# Patient Record
Sex: Female | Born: 2005
Health system: Southern US, Community
[De-identification: ages and names within clinical notes are randomized; demographics above are authoritative.]

## PROBLEM LIST (undated history)

## (undated) ENCOUNTER — Emergency Department (HOSPITAL_COMMUNITY): Admission: EM | Payer: Medicaid Other | Source: Home / Self Care

## (undated) DIAGNOSIS — U071 COVID-19: Secondary | ICD-10-CM

## (undated) DIAGNOSIS — F909 Attention-deficit hyperactivity disorder, unspecified type: Secondary | ICD-10-CM

## (undated) HISTORY — PX: TONSILLECTOMY: SUR1361

## (undated) HISTORY — PX: EYE SURGERY: SHX253

## (undated) HISTORY — DX: Attention-deficit hyperactivity disorder, unspecified type: F90.9

---
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
## 2023-02-04 ENCOUNTER — Other Ambulatory Visit (HOSPITAL_COMMUNITY): Payer: Self-pay

## 2023-02-26 ENCOUNTER — Other Ambulatory Visit (HOSPITAL_COMMUNITY): Payer: Self-pay

## 2023-03-05 DIAGNOSIS — F902 Attention-deficit hyperactivity disorder, combined type: Secondary | ICD-10-CM | POA: Diagnosis not present

## 2023-03-07 ENCOUNTER — Ambulatory Visit (INDEPENDENT_AMBULATORY_CARE_PROVIDER_SITE_OTHER): Payer: Medicaid Other | Admitting: Pediatrics

## 2023-03-07 ENCOUNTER — Other Ambulatory Visit (HOSPITAL_COMMUNITY): Payer: Self-pay

## 2023-03-07 ENCOUNTER — Encounter: Payer: Self-pay | Admitting: Pediatrics

## 2023-03-07 VITALS — BP 102/62 | Ht 66.5 in | Wt 125.5 lb

## 2023-03-07 DIAGNOSIS — Z0000 Encounter for general adult medical examination without abnormal findings: Secondary | ICD-10-CM | POA: Diagnosis not present

## 2023-03-07 DIAGNOSIS — Z23 Encounter for immunization: Secondary | ICD-10-CM

## 2023-03-07 DIAGNOSIS — Z68.41 Body mass index (BMI) pediatric, 5th percentile to less than 85th percentile for age: Secondary | ICD-10-CM

## 2023-03-07 DIAGNOSIS — Z3041 Encounter for surveillance of contraceptive pills: Secondary | ICD-10-CM | POA: Insufficient documentation

## 2023-03-07 MED ORDER — NORETHIN-ETH ESTRAD-FE BIPHAS 1 MG-10 MCG / 10 MCG PO TABS
1.0000 | ORAL_TABLET | Freq: Every day | ORAL | 2 refills | Status: DC
  Filled 2023-03-07: qty 28, 28d supply, fill #0
  Filled 2023-06-20: qty 28, 28d supply, fill #1

## 2023-03-07 NOTE — Progress Notes (Signed)
 Adolescent Well Care Visit Shannon Owens is a 18 y.o. female who is here for well care.    PCP:  Harrell Gave, NP   History was provided by the patient.  Confidentiality was discussed with the patient and, if applicable, with caregiver as well.  Current Issues: Current concerns include: needs blood work today for [employer]; has started taking iron because was told at ER once that she had low iron.  No medications- saw psychiatrist on Monday. Stopped taking ADHD medications in August 2024, feeling like she is doing really well in school. No fous problems or concerns  Menstruation- regular, last cycle January 25th  Taking birth control daily. No other medications.  Nutrition: Nutrition/Eating Behaviors: good- eating 2 meals a day and snacks Adequate calcium in diet?: yes Supplements/ Vitamins: yes- iron  Exercise/ Media: Play any Sports?/ Exercise: yes Screen Time:  < 2 hours Media Rules or Monitoring?: yes  Sleep:  Sleep: >8 hours - no trouble falling asleep or staying asleep  Social Screening: Lives with: with grandmother Parental relations:  good Activities, Work, and Regulatory affairs officer?: school Concerns regarding behavior with peers?  no Stressors of note: none. Doing well with no concerns for depression or anxiety  Education: School Grade: [employer] School performance: doing well; no concerns  Confidential Social History: Tobacco? no Secondhand smoke exposure?  no Drugs/ETOH?  no  Sexually Active? no   Pregnancy Prevention: OCP  Safe at home, in school & in relationships?  Yes Safe to self?  Yes   Screenings: Patient has a dental home: yes  The following were discussed: eating habits, exercise habits, safety equipment use, bullying, abuse and/or trauma, weapon use, tobacco use, other substance use, reproductive health, and mental health.  Issues were addressed and counseling provided.    Additional topics were addressed as anticipatory  guidance.  PHQ-9 completed and results indicated no risks  Physical Exam:  Vitals:   03/07/23 0846  BP: 102/62  Weight: 125 lb 8 oz (56.9 kg)  Height: 5' 6.5" (1.689 m)   BP 102/62   Ht 5' 6.5" (1.689 m)   Wt 125 lb 8 oz (56.9 kg)   LMP 02/02/2023 (Exact Date)   BMI 19.95 kg/m  Body mass index: body mass index is 19.95 kg/m. Blood pressure %iles are not available for patients who are 18 years or older.  Hearing Screening   500Hz  1000Hz  2000Hz  3000Hz  4000Hz   Right ear 25 25 20 20 20   Left ear 25 25 20 20 20    Vision Screening   Right eye Left eye Both eyes  Without correction     With correction 10/10 10/10     General Appearance:   alert, oriented, no acute distress and well nourished  HENT: Normocephalic, no obvious abnormality, conjunctiva clear  Mouth:   Normal appearing teeth, no obvious discoloration, dental caries, or dental caps  Neck:   Supple; thyroid: no enlargement, symmetric, no tenderness/mass/nodules  Chest normal  Lungs:   Clear to auscultation bilaterally, normal work of breathing  Heart:   Regular rate and rhythm, S1 and S2 normal, no murmurs;   Abdomen:   Soft, non-tender, no mass, or organomegaly  GU normal female  Musculoskeletal:   Tone and strength strong and symmetrical, all extremities               Lymphatic:   No cervical adenopathy  Skin/Hair/Nails:   Skin warm, dry and intact, no rashes, no bruises or petechiae  Neurologic:   Strength, gait,  and coordination normal and age-appropriate    Assessment and Plan:   Well adolescent female BMI is appropriate for age  Hearing screening result:normal Vision screening result: normal  OCP- continue using as directed; medication refilled x 90 days  Orders Placed This Encounter  Procedures   Meningococcal B, OMV   HPV 9-valent vaccine,Recombinat   CBC with Differential/Platelet   Food Allergy Profile   Hepatitis B surface antigen   Hepatitis C antibody   HIV Antibody (routine testing w  rflx)   Lipid panel   Resp Allergy Profile Regn2[facility] [place] [facility] [place] [facility]   RPR   T4, free   TSH   Comprehensive metabolic panel   Men B & HPV vaccines per orders. Indications, contraindications and side effects of vaccine/vaccines discussed with parent and parent verbally expressed understanding and also agreed with the administration of vaccine/vaccines as ordered above today.Handout (VIS) given for each vaccine at this visit.  Lab tests as ordered are REQUIRED for [employer] acceptance. Will return next week for TB skin test.  Return in about 1 year (around 03/06/2024).Harrell Gave, NP

## 2023-03-07 NOTE — Patient Instructions (Signed)
 Well Child Nutrition, Young Adult The following information provides general nutrition recommendations. Talk with a health care provider or a diet and nutrition specialist (dietitian) if you have any questions. Nutrition The amount of food you need to eat every day depends on your age, sex, size, and activity level. To figure out your daily calorie needs, look for a calorie calculator online or talk with your health care provider. Balanced diet Eat a balanced diet. Try to include: Fruits. Aim for 1-2 cups a day. Examples of 1 cup of fruit include 1 large banana, 1 small apple, 8 large strawberries, 1 large orange,  cup (80 g) dried fruit, or 1 cup (250 mL) of 100% fruit juice. Eat a variety of whole fruits and 100% fruit juice. Choose fresh, canned, frozen, or dried forms. Choose canned fruit that has the lowest added sugar or no added sugar. Vegetables. Aim for 2-4 cups a day. Examples of 1 cup of vegetables include 2 medium carrots, 1 large tomato, 2 stalks of celery, or 2 cups (62 g) of raw leafy greens. Choose fresh, frozen, canned, and dried options. Eat vegetables of a variety of colors. Low-fat or fat-free dairy. Aim for 3 cups a day. Examples of 1 cup of dairy include 8 oz (230 mL) of milk, 8 oz (230 g) of yogurt, or 1 oz (44 g) of natural cheese. If you are unable to tolerate dairy (lactose intolerant) or you choose not to consume dairy, you may include fortified soy beverages (soy milk). Grains. Aim for 6-10 "ounce-equivalents" of grain foods (such as pasta, rice, and tortillas) a day. Examples of 1 ounce-equivalent of grains include 1 cup (60 g) of ready-to-eat cereal,  cup (79 g) of cooked rice, or 1 slice of bread. Of the grain foods that you eat each day, aim to include 3-5 ounce-equivalents of whole-grain options. Examples of whole grains include whole wheat, brown rice, wild rice, quinoa, and oats. Lean proteins. Aim for 5-7 ounce-equivalents a day. Eat a variety of protein foods,  including lean meats, seafood, poultry, eggs, legumes (beans and peas), nuts, seeds, and soy products. A cut of meat or fish that is the size of a deck of cards is about 3-4 ounce-equivalents (85 g). Foods that provide 1 ounce-equivalent of protein include 1 egg,  ounce (28 g) of nuts or seeds, or 1 tablespoon (16 g) of peanut butter. For more information and options for foods in a balanced diet, visit www.DisposableNylon.be Tips for healthy snacking A snack should not be the size of a full meal. Eat snacks that have 200 calories or less. Examples include:  whole-wheat pita with  cup (40 g) hummus. 2 or 3 slices of deli [place] wrapped around a cheese stick.  apple with 1 tablespoon (16 g) of peanut butter. 10 baked chips with salsa. Keep cut-up fruits and vegetables available at home and at school so they are easy to eat. Pack healthy snacks the night before or when you pack your lunch. Avoid pre-packaged foods. These tend to be higher in fat, sugar, and salt (sodium). Get involved with shopping, or ask the primary food shopper in your household to get healthy snacks that you like. Avoid chips, candy, cake, and soft drinks. Foods to avoid [place] or heavily processed foods, such as toaster pastries and microwaveable dinners. Drinks that contain a lot of sugar, such as sports drinks, sodas, and juice. Foods that contain a lot of fat, sodium, or sugar. Food safety Prepare your food safely: Wash your hands  after handling raw meats. Keep food preparation surfaces clean by washing them regularly with hot, soapy water. Keep raw meats separate from foods that are ready-to-eat, such as fruits and vegetables. Cook seafood, meat, poultry, and eggs to the recommended minimum safe internal temperature. Store foods at safe temperatures. In general: Keep cold foods at 71F (4C) or colder. Keep your freezer at 79F (-18C or 18 degrees below 0C) or colder. Keep hot foods at 171F (60C) or  warmer. Foods are no longer safe to eat when they have been at a temperature of 40-171F (4-60C) for more than 2 hours. Physical activity Try to get 150 minutes of moderate-intensity physical activity each week. Examples include walking briskly or bicycling slower than 10 miles an hour (16 km an hour). Do muscle-strengthening exercises on 2 or more days a week. If you find it difficult to fit regular physical activity into your schedule, try: Taking the stairs instead of the elevator. Parking your car farther from the entrance or at the back of the parking lot. Biking or walking to work or school. If you need to lose weight, you may need to reduce your daily calorie intake and increase your daily amount of physical activity. Check with your health care provider before you start a new diet and exercise plan. General instructions Do not skip meals, especially breakfast. Water is the ideal beverage. Aim to drink six 8-oz (240 mL) glasses of water each day. Avoid fad diets. These may affect your mood and growth. If you choose to drink alcohol: Drink in moderation. This means two drinks a day for men and one drink a day for women who are not pregnant. One drink equals 12 oz (355 mL) of beer, 5 oz (148 mL) of wine, or 1 oz (44 mL) of hard liquor. You may drink coffee. It is recommended that you limit coffee intake to three to five 8-oz (240 mL) cups a day (up to 400 mg of caffeine). If you are worried about your body image, talk with your parents, your health care provider, or another trusted adult like a coach or counselor. You may be at risk for developing an eating disorder. Eating disorders can lead to serious medical problems. Food allergies may cause you to have a reaction (such as a rash, diarrhea, or vomiting) after eating or drinking. Talk with your health care provider if you have concerns about food allergies. Summary Eat a balanced diet. Include fruits, vegetables, low-fat dairy, whole  grains, and lean proteins. Try to get 150 minutes of moderate-intensity physical activity each week, and do muscle-strengthening exercises on 2 or more days a week. Choose healthy snacks that are 200 calories or less. Drink plenty of water. Try to drink six 8-oz (240 mL) glasses a day. This information is not intended to replace advice given to you by your health care provider. Make sure you discuss any questions you have with your health care provider. Document Revised: 12/13/2020 Document Reviewed: 12/13/2020 Elsevier Patient Education  2024 [employer].

## 2023-03-11 LAB — CAT DANDER COMPONENT
E220-IgE Fel d 2: <0.1 kU/L (ref <0.10)
E228-IgE Fel d 4: <0.1 kU/L (ref <0.10)
Fel d 1 (e94) IgE: 2.15 kU/L — ABNORMAL HIGH (ref <0.10)
Fel d 7 (e231) IgE: 0.18 kU/L — ABNORMAL HIGH (ref <0.10)

## 2023-03-11 LAB — FOOD ALLERGY PROFILE
Allergen, Salmon, f41: <0.1 kU/L
Almonds: <0.1 kU/L
Brazil Nut: <0.1 kU/L
CLASS: 0
CLASS: 0
CLASS: 0
CLASS: 0
CLASS: 0
CLASS: 0
CLASS: 0
CLASS: 0
CLASS: 0
Cashew IgE: <0.1 kU/L
Class: 0
Class: 0
Class: 0
Class: 0
Class: 0
Egg White IgE: <0.1 kU/L
Fish Cod: <0.1 kU/L
Hazelnut: 0.12 kU/L — ABNORMAL HIGH
Macadamia Nut: <0.1 kU/L
Milk IgE: <0.1 kU/L
Peanut IgE: <0.1 kU/L
Scallop IgE: <0.1 kU/L
Sesame Seed f10: 0.33 kU/L — ABNORMAL HIGH
Shrimp IgE: <0.1 kU/L
Soybean IgE: <0.1 kU/L
Tuna IgE: <0.1 kU/L
Walnut: <0.1 kU/L
Wheat IgE: 0.26 kU/L — ABNORMAL HIGH

## 2023-03-11 LAB — RESPIRATORY ALLERGY PROFILE REGION II ~~LOC~~
Allergen, A. alternata, m6: <0.1 kU/L
Allergen, Cedar tree, t12: <0.1 kU/L
Allergen, Comm Silver Birch, t9: 0.29 kU/L — ABNORMAL HIGH
Allergen, Cottonwood, t14: 0.15 kU/L — ABNORMAL HIGH
Allergen, D pternoyssinus,d7: 3.42 kU/L — ABNORMAL HIGH
Allergen, Mouse Urine Protein, e78: <0.1 kU/L
Allergen, Mulberry, t76: <0.1 kU/L
Allergen, Oak,t7: 0.46 kU/L — ABNORMAL HIGH
Allergen, P. notatum, m1: <0.1 kU/L
Aspergillus fumigatus, m3: <0.1 kU/L
Bermuda Grass: <0.1 kU/L
Box Elder IgE: <0.1 kU/L
CLADOSPORIUM HERBARUM (M2) IGE: <0.1 kU/L
COMMON RAGWEED (SHORT) (W1) IGE: 0.12 kU/L — ABNORMAL HIGH
Cat Dander: 2.05 kU/L — ABNORMAL HIGH
Class: 0
Class: 0
Class: 0
Class: 0
Class: 0
Class: 0
Class: 0
Class: 0
Class: 0
Class: 0
Class: 0
Class: 0
Class: 0
Class: 1
Class: 1
Class: 2
Class: 2
Class: 2
Class: 3
Cockroach: <0.1 kU/L
D. farinae: 5.17 kU/L — ABNORMAL HIGH
Dog Dander: 2.87 kU/L — ABNORMAL HIGH
Elm IgE: 0.23 kU/L — ABNORMAL HIGH
IgE (Immunoglobulin E), Serum: 28 kU/L (ref <=114)
Johnson Grass: <0.1 kU/L
Pecan/Hickory Tree IgE: 0.24 kU/L — ABNORMAL HIGH
Rough Pigweed  IgE: <0.1 kU/L
Sheep Sorrel IgE: <0.1 kU/L
Timothy Grass: 0.56 kU/L — ABNORMAL HIGH

## 2023-03-11 LAB — COMPREHENSIVE METABOLIC PANEL
AG Ratio: 2.1 (calc) (ref 1.0–2.5)
ALT: 8 U/L (ref 5–32)
AST: 16 U/L (ref 12–32)
Albumin: 4.8 g/dL (ref 3.6–5.1)
Alkaline phosphatase (APISO): 70 U/L (ref 36–128)
BUN: 11 mg/dL (ref 7–20)
CO2: 24 mmol/L (ref 20–32)
Calcium: 9.4 mg/dL (ref 8.9–10.4)
Chloride: 107 mmol/L (ref 98–110)
Creat: 0.87 mg/dL (ref 0.50–0.96)
Globulin: 2.3 g/dL (ref 2.0–3.8)
Glucose, Bld: 84 mg/dL (ref 65–99)
Potassium: 3.9 mmol/L (ref 3.8–5.1)
Sodium: 139 mmol/L (ref 135–146)
Total Bilirubin: 0.7 mg/dL (ref 0.2–1.1)
Total Protein: 7.1 g/dL (ref 6.3–8.2)

## 2023-03-11 LAB — HEPATITIS B SURFACE ANTIGEN: Hepatitis B Surface Ag: NONREACTIVE

## 2023-03-11 LAB — CBC WITH DIFFERENTIAL/PLATELET
Absolute Lymphocytes: 2871 {cells}/uL (ref 850–3900)
Absolute Monocytes: 451 {cells}/uL (ref 200–950)
Basophils Absolute: 20 {cells}/uL (ref 0–200)
Basophils Relative: 0.4 %
Eosinophils Absolute: 142 {cells}/uL (ref 15–500)
Eosinophils Relative: 2.9 %
HCT: 36.2 % (ref 35.0–45.0)
Hemoglobin: 11.3 g/dL — ABNORMAL LOW (ref 11.7–15.5)
MCH: 24.4 pg — ABNORMAL LOW (ref 27.0–33.0)
MCHC: 31.2 g/dL — ABNORMAL LOW (ref 32.0–36.0)
MCV: 78.2 fL — ABNORMAL LOW (ref 80.0–100.0)
MPV: 9.5 fL (ref 7.5–12.5)
Monocytes Relative: 9.2 %
Neutro Abs: 1416 {cells}/uL — ABNORMAL LOW (ref 1500–7800)
Neutrophils Relative %: 28.9 %
Platelets: 341 10*3/uL (ref 140–400)
RBC: 4.63 10*6/uL (ref 3.80–5.10)
RDW: 16.2 % — ABNORMAL HIGH (ref 11.0–15.0)
Total Lymphocyte: 58.6 %
WBC: 4.9 10*3/uL (ref 3.8–10.8)

## 2023-03-11 LAB — DOG DANDER COMPONENT
Can f 4(e229) IgE: 0.72 kU/L — ABNORMAL HIGH (ref <0.10)
Can f 6(e230) IgE: 0.65 kU/L — ABNORMAL HIGH (ref <0.10)
E101-IgE Can f 1: 0.51 kU/L — ABNORMAL HIGH (ref <0.10)
E102-IgE Can f 2: 0.55 kU/L — ABNORMAL HIGH (ref <0.10)
E221-IgE Can f 3: <0.1 kU/L (ref <0.10)
E226-IgE Can f 5: 1.7 kU/L — ABNORMAL HIGH (ref <0.10)

## 2023-03-11 LAB — LIPID PANEL
Cholesterol: 180 mg/dL — ABNORMAL HIGH (ref <170)
HDL: 56 mg/dL (ref >45)
LDL Cholesterol (Calc): 107 mg/dL (ref <110)
Non-HDL Cholesterol (Calc): 124 mg/dL — ABNORMAL HIGH (ref <120)
Total CHOL/HDL Ratio: 3.2 (calc) (ref <5.0)
Triglycerides: 78 mg/dL (ref <90)

## 2023-03-11 LAB — HIV ANTIBODY (ROUTINE TESTING W REFLEX): HIV 1&2 Ab, 4th Generation: NONREACTIVE

## 2023-03-11 LAB — HEPATITIS C ANTIBODY: Hepatitis C Ab: NONREACTIVE

## 2023-03-11 LAB — TSH: TSH: 2.07 m[IU]/L

## 2023-03-11 LAB — INTERPRETATION:

## 2023-03-11 LAB — RPR: RPR Ser Ql: NONREACTIVE

## 2023-03-11 LAB — T4, FREE: Free T4: 1.1 ng/dL (ref 0.8–1.4)

## 2023-03-16 ENCOUNTER — Ambulatory Visit
Admission: EM | Admit: 2023-03-16 | Discharge: 2023-03-16 | Disposition: A | Discharge status: Home / Self Care | Attending: Family Medicine | Admitting: Family Medicine

## 2023-03-16 ENCOUNTER — Other Ambulatory Visit: Payer: Self-pay

## 2023-03-16 DIAGNOSIS — H6993 Unspecified Eustachian tube disorder, bilateral: Secondary | ICD-10-CM

## 2023-03-16 DIAGNOSIS — H9203 Otalgia, bilateral: Secondary | ICD-10-CM

## 2023-03-16 NOTE — ED Provider Notes (Signed)
 [facility]    CSN: [id number] Arrival date & time: 03/16/23  1152      History   Chief Complaint Chief Complaint  Patient presents with   Otalgia    HPI Shannon Owens is a 18 y.o. female.   The history is provided by the patient.  Otalgia Bilateral ear pain for 4 months, no drainage, has history of frequent ear infections in the past.  Admits slight rhinorrhea.  Admits itchy throat.  Denies fever, chills, sweats, drainage from ears, change in hearing, swollen glands, cough, headache, nausea, vomiting, diarrhea.  No over-the-counter remedies tried.  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      Home Medications    Prior to Admission medications   Medication Sig Start Date End Date Taking? Authorizing Provider  Norethindrone-Ethinyl Estradiol-Fe Biphas (LO LOESTRIN FE) 1 MG-10 MCG / 10 MCG tablet Take 1 tablet by mouth daily. 03/07/23   Harrell Gave, NP    Family History Family History  Problem Relation Age of Onset   Healthy Mother    Healthy Father    Hypertension Paternal Grandmother    Hypertension Paternal Grandfather    Diabetes Paternal Grandfather    Alcohol abuse Neg Hx    Arthritis Neg Hx    Asthma Neg Hx    Birth defects Neg Hx    Cancer Neg Hx    COPD Neg Hx    Depression  Neg Hx    Drug abuse Neg Hx    Early death Neg Hx    Hearing loss Neg Hx    Heart disease Neg Hx    Hyperlipidemia Neg Hx    Learning disabilities Neg Hx    Kidney disease Neg Hx    Mental illness Neg Hx    Mental retardation Neg Hx    Miscarriages / Stillbirths Neg Hx    Stroke Neg Hx    Vision loss Neg Hx    Varicose Veins Neg Hx     Social History Social History   Tobacco Use   Smoking status: Never   Smokeless tobacco: Never  Vaping Use   Vaping status: Never Used  Substance Use Topics   Alcohol use: Never   Drug use: Never     Allergies   Patient has no known allergies.   Review of Systems Review of Systems  HENT:  Positive for ear pain.      Physical Exam Triage Vital Signs ED Triage Vitals  Encounter Vitals Group     BP 03/16/23 1211 117/79     Systolic BP Percentile --      Diastolic BP Percentile --      Pulse Rate 03/16/23 1211 97     Resp 03/16/23 1211 18     Temp  03/16/23 1211 98.1 F (36.7 C)     Temp Source 03/16/23 1211 Oral     SpO2 03/16/23 1211 96 %     Weight 03/16/23 1209 130 lb (59 kg)     Height 03/16/23 1209 5' 6.5" (1.689 m)     Head Circumference --      Peak Flow --      Pain Score 03/16/23 1209 5     Pain Loc --      Pain Education --      Exclude from Growth Chart --    No data found.  Updated Vital Signs BP 117/79 (BP Location: Right Arm)   Pulse 97   Temp 98.1 F (36.7 C) (Oral)   Resp 18   Ht 5' 6.5" (1.689 m)   Wt 130 lb (59 kg)   LMP 02/02/2023 (Exact Date)   SpO2 96%   BMI 20.67 kg/m   Visual Acuity Right Eye Distance:   Left Eye Distance:   Bilateral Distance:    Right Eye Near:   Left Eye Near:    Bilateral Near:     Physical Exam Constitutional:      Appearance: She is not ill-appearing.  HENT:     Head: Normocephalic.     Right Ear: Tympanic membrane and ear canal normal.     Left Ear: Tympanic membrane and ear canal normal.     Nose: Congestion present. No rhinorrhea.     Mouth/Throat:      Mouth: Mucous membranes are moist.     Pharynx: No oropharyngeal exudate.  Eyes:     General:        Right eye: No discharge.        Left eye: No discharge.     Conjunctiva/sclera: Conjunctivae normal.  Cardiovascular:     Rate and Rhythm: Normal rate and regular rhythm.     Heart sounds: Normal heart sounds.  Pulmonary:     Effort: Pulmonary effort is normal. No respiratory distress.     Breath sounds: Normal breath sounds. No wheezing or rales.  Musculoskeletal:     Cervical back: Neck supple.  Lymphadenopathy:     Cervical: No cervical adenopathy.  Skin:    General: Skin is warm and dry.     Findings: No rash.  Neurological:     Mental Status: She is alert.      UC Treatments / Results  Labs (all labs ordered are listed, but only abnormal results are displayed) Labs Reviewed - No data to display  EKG   Radiology No results found.  Procedures Procedures (including critical care time)  Medications Ordered in UC Medications - No data to display  Initial Impression / Assessment and Plan / UC Course  I have reviewed the triage vital signs and the nursing notes.  Pertinent labs & imaging results that were available during my care of the patient were reviewed by me and considered in my medical decision making (see chart for details).     18 year old female with ear pain for months.  Has nasal congestion on exam discussed with patient likely eustachian tube dysfunction recommend she start a regimen of Flonase and daily antihistamine, follow-up if symptoms fail to improve Final Clinical Impressions(s) / UC Diagnoses   Final diagnoses:  Otalgia of both ears  Eustachian tube dysfunction, bilateral     Discharge Instructions      Recommend over-the-counter Flonase 2 sprays each nostril once daily and over-the-counter antihistamine of choice as directed on  the package daily   ED Prescriptions   None    PDMP not reviewed this encounter.   Meliton Rattan, [place] 03/16/23 1242

## 2023-03-16 NOTE — Discharge Instructions (Addendum)
 Recommend over-the-counter Flonase 2 sprays each nostril once daily and over-the-counter antihistamine of choice as directed on the package daily

## 2023-03-16 NOTE — ED Triage Notes (Signed)
 Pt presents with complaints of ringing in ears, dizziness, and bilateral ear pain (more on the right side) x 4 months. Pt states she did feel the need to have her ears checked until recently because the symptoms/pain come and go. Pt currently rates her overall pain a 5/10. Ice applied to ears and alternating Tylenol/Advil with some relief.

## 2023-03-27 DIAGNOSIS — F902 Attention-deficit hyperactivity disorder, combined type: Secondary | ICD-10-CM | POA: Diagnosis not present

## 2023-05-08 ENCOUNTER — Ambulatory Visit (INDEPENDENT_AMBULATORY_CARE_PROVIDER_SITE_OTHER): Admitting: Pediatrics

## 2023-05-08 ENCOUNTER — Encounter: Payer: Self-pay | Admitting: Pediatrics

## 2023-05-08 VITALS — Wt 132.7 lb

## 2023-05-08 DIAGNOSIS — D509 Iron deficiency anemia, unspecified: Secondary | ICD-10-CM | POA: Insufficient documentation

## 2023-05-08 LAB — POCT HEMOGLOBIN: Hemoglobin: 10 g/dL (ref 11–14.6)

## 2023-05-08 NOTE — Progress Notes (Signed)
 Presents today for iron re-check based on history of anemia. Has not been taking iron supplementation recently.   Results for orders placed or performed in visit on 05/08/23 (from the past 24 hours)  POCT hemoglobin     Status: None   Collection Time: 05/08/23 12:11 PM  Result Value Ref Range   Hemoglobin 10.0 11 - 14.6 g/dL   Continue iron supplementation as recommended

## 2023-05-08 NOTE — Patient Instructions (Signed)
 Iron-Rich Diet  Iron is a mineral that helps your body produce hemoglobin. Hemoglobin is a protein in red blood cells that carries oxygen to your body's tissues. Eating too little iron may cause you to feel weak and tired, and it can increase your risk of infection. Iron is naturally found in many foods, and many foods have iron added to them (are iron-fortified). You may need to follow an iron-rich diet if you do not have enough iron in your body due to certain medical conditions. The amount of iron that you need each day depends on your age, your sex, and any medical conditions you have. Follow instructions from your health care provider or a dietitian about how much iron you should eat each day. What are tips for following this plan? Reading food labels Check food labels to see how many milligrams (mg) of iron are in each serving. Cooking Cook foods in pots and pans that are made from iron. Take these steps to make it easier for your body to absorb iron from certain foods: Soak beans overnight before cooking. Soak whole grains overnight and drain them before using. Ferment flours before baking, such as by using yeast in bread dough. Meal planning When you eat foods that contain iron, you should eat them with foods that are high in vitamin C. These include oranges, peppers, tomatoes, potatoes, and mangoes. Vitamin C helps your body absorb iron. Certain foods and drinks prevent your body from absorbing iron properly. Avoid eating these foods in the same meal as iron-rich foods or with iron supplements. These foods include: Coffee, black tea, and red wine. Milk, dairy products, and foods that are high in calcium. Beans and soybeans. Whole grains. General information Take iron supplements only as told by your health care provider. An overdose of iron can be life-threatening. If you were prescribed iron supplements, take them with orange juice or a vitamin C supplement. When you eat  iron-fortified foods or take an iron supplement, you should also eat foods that naturally contain iron, such as meat, poultry, and fish. Eating naturally iron-rich foods helps your body absorb the iron that is added to other foods or contained in a supplement. Iron from animal sources is better absorbed than iron from plant sources. What foods should I eat? Fruits Prunes. Raisins. Eat fruits high in vitamin C, such as oranges, grapefruits, and strawberries, with iron-rich foods. Vegetables Spinach (cooked). Green peas. Broccoli. Fermented vegetables. Eat vegetables high in vitamin C, such as leafy greens, potatoes, bell peppers, and tomatoes, with iron-rich foods. Grains Iron-fortified breakfast cereal. Iron-fortified whole-wheat bread. Enriched rice. Sprouted grains. Meats and other proteins Beef liver. Beef. [place]. Chicken. Oysters. Shrimp. Tuna. Sardines. Chickpeas. Nuts. Tofu. Pumpkin seeds. Beverages Tomato juice. Fresh orange juice. Prune juice. Hibiscus tea. Iron-fortified instant breakfast shakes. Sweets and desserts Blackstrap molasses. Seasonings and condiments Tahini. Fermented soy sauce. Other foods Wheat germ. The items listed above may not be a complete list of recommended foods and beverages. Contact a dietitian for more information. What foods should I limit? These are foods that should be limited while eating iron-rich foods as they can reduce the absorption of iron in your body. Grains Whole grains. Bran cereal. Bran flour. Meats and other proteins Soybeans. Products made from soy protein. Black beans. Lentils. Mung beans. Split peas. Dairy Milk. Cream. Cheese. Yogurt. Cottage cheese. Beverages Coffee. Black tea. Red wine. Sweets and desserts Cocoa. Chocolate. Ice cream. Seasonings and condiments Basil. Oregano. Large amounts of parsley. The items listed  above may not be a complete list of foods and beverages you should limit. Contact a dietitian for more  information. Summary Iron is a mineral that helps your body produce hemoglobin. Hemoglobin is a protein in red blood cells that carries oxygen to your body's tissues. Iron is naturally found in many foods, and many foods have iron added to them (are iron-fortified). When you eat foods that contain iron, you should eat them with foods that are high in vitamin C. Vitamin C helps your body absorb iron. Certain foods and drinks prevent your body from absorbing iron properly, such as whole grains and dairy products. You should avoid eating these foods in the same meal as iron-rich foods or with iron supplements. This information is not intended to replace advice given to you by your health care provider. Make sure you discuss any questions you have with your health care provider. Document Revised: 12/07/2019 Document Reviewed: 12/07/2019 Elsevier Patient Education  2024 [employer].

## 2023-06-01 ENCOUNTER — Telehealth: Admitting: Physician Assistant

## 2023-06-01 ENCOUNTER — Other Ambulatory Visit (HOSPITAL_COMMUNITY): Payer: Self-pay

## 2023-06-01 DIAGNOSIS — B3731 Acute candidiasis of vulva and vagina: Secondary | ICD-10-CM | POA: Diagnosis not present

## 2023-06-01 MED ORDER — FLUCONAZOLE 150 MG PO TABS
150.0000 mg | ORAL_TABLET | ORAL | 0 refills | Status: DC | PRN
Start: 2023-06-01 — End: 2023-06-29
  Filled 2023-06-01: qty 2, 6d supply, fill #0

## 2023-06-01 NOTE — Patient Instructions (Signed)
 [facility], thank you for joining Angelia Kelp, PA-C for today's virtual visit.  While this provider is not your primary care provider (PCP), if your PCP is located in our provider database this encounter information will be shared with them immediately following your visit.   A [facility] MyChart account gives you access to today's visit and all your visits, tests, and labs performed at [facility] " click here if you don't have a [facility] MyChart account or go to [email or web address]  Consent: (Patient) [facility] Owens provided verbal consent for this virtual visit at the beginning of the encounter.  Current Medications:  Current Outpatient Medications:    fluconazole  (DIFLUCAN ) 150 MG tablet, Take 1 tablet (150 mg total) by mouth every 3 (three) days as needed., Disp: 2 tablet, Rfl: 0   Norethindrone-Ethinyl Estradiol -Fe Biphas (LO LOESTRIN FE ) 1 MG-10 MCG / 10 MCG tablet, Take 1 tablet by mouth daily., Disp: 28 tablet, Rfl: 2   Medications ordered in this encounter:  Meds ordered this encounter  Medications   fluconazole  (DIFLUCAN ) 150 MG tablet    Sig: Take 1 tablet (150 mg total) by mouth every 3 (three) days as needed.    Dispense:  2 tablet    Refill:  0    Supervising Provider:   LAMPTEY, PHILIP O [[id number]]     *If you need refills on other medications prior to your next appointment, please contact your pharmacy*  Follow-Up: Call back or seek an in-person evaluation if the symptoms worsen or if the condition fails to improve as anticipated.  [facility] [phone]  Other Instructions Healthy vaginal hygiene practices    -  Avoid sleeper pajamas. Nightgowns allow air to circulate.  Sleep without underpants whenever possible.   -  Wear cotton underpants during the day. Double-rinse underwear after washing to avoid residual irritants. Do not use fabric softeners for underwear and swimsuits.   - Avoid tights, leotards,  leggings, "skinny" jeans, and other tight-fitting clothing. Skirts and loose-fitting pants allow air to circulate.   - Avoid pantyliners.  Instead use tampons or cotton pads.   - Use the restroom after intercourse to help prevent UTI's   - Daily warm bathing is helpful:     - Soak in clean water (no soap) for 10 to 15 minutes. Adding vinegar or baking soda to the water has not been specifically studied and may not be better than clean water alone.      - Use soap to wash regions other than the genital area just before getting out of the tub. Limit use of any soap on genital areas. Use fragance-free soaps.     - Rinse the genital area well and gently pat dry.  Don't rub.  Hair dryer to assist with drying can be used only if on cool setting.     - Do not use bubble baths or perfumed soaps.   - Do not use any feminine sprays, douches or powders.  These contain chemicals that will irritate the skin.   - If the genital area is tender or swollen, cool compresses may relieve the discomfort. Unscented wet wipes can be used instead of toilet paper for wiping.    - Emollients, such as Vaseline, may help protect skin and can be applied to the irritated area.   - Always remember to wipe front-to-back after bowel movements. Pat dry after urination.   - Do not sit in wet swimsuits for long periods of  time after swimming    If you have been instructed to have an in-person evaluation today at a local Urgent Care facility, please use the link below. It will take you to a list of all of our available [facility], including address, phone number and hours of operation. Please do not delay care.  [facility]  If you or a family member do not have a primary care provider, use the link below to schedule a visit and establish care. When you choose a [facility] primary care physician or advanced practice provider, you gain a long-term partner in health. Find a Primary Care  Provider  Learn more about [facility]'s in-office and virtual care options: [facility] - Get Care Now

## 2023-06-01 NOTE — Progress Notes (Signed)
 Virtual Visit Consent   Trina Fujita, you are scheduled for a virtual visit with a [name] provider today. Just as with appointments in the office, your consent must be obtained to participate. Your consent will be active for this visit and any virtual visit you may have with one of our providers in the next 365 days. If you have a MyChart account, a copy of this consent can be sent to you electronically.  As this is a virtual visit, video technology does not allow for your provider to perform a traditional examination. This may limit your provider's ability to fully assess your condition. If your provider identifies any concerns that need to be evaluated in person or the need to arrange testing (such as labs, EKG, etc.), we will make arrangements to do so. Although advances in technology are sophisticated, we cannot ensure that it will always work on either your end or our end. If the connection with a video visit is poor, the visit may have to be switched to a telephone visit. With either a video or telephone visit, we are not always able to ensure that we have a secure connection.  By engaging in this virtual visit, you consent to the provision of healthcare and authorize for your insurance to be billed (if applicable) for the services provided during this visit. Depending on your insurance coverage, you may receive a charge related to this service.  I need to obtain your verbal consent now. Are you willing to proceed with your visit today? [name] has provided verbal consent on 06/01/2023 for a virtual visit (video or telephone). Angelia Kelp, PA-C  Date: 06/01/2023 10:31 AM   Virtual Visit via Video Note   I, Angelia Kelp, connected with  Sh[facility]  ([id number], September 02, 2005) on 06/01/23 at 10:30 AM EDT by a video-enabled telemedicine application and verified that I am speaking with the correct person using two identifiers.  Location: Patient: Virtual  Visit Location Patient: Home Provider: Virtual Visit Location Provider: Home Office   I discussed the limitations of evaluation and management by telemedicine and the availability of in person appointments. The patient expressed understanding and agreed to proceed.    History of Present Illness: [facility] Owens is a 18 y.o. who identifies as a female who was assigned female at birth, and is being seen today for vaginal irritation.  HPI: Vaginal Discharge The patient's primary symptoms include genital itching and vaginal discharge. This is a new problem. The current episode started yesterday. The problem occurs constantly. She is not pregnant. Associated symptoms include abdominal pain. Pertinent negatives include no back pain, chills, diarrhea, dysuria, fever, frequency, headaches, hematuria, nausea or urgency. The vaginal discharge was white and thick. There has been no bleeding. She has not been passing clots. She has not been passing tissue. The symptoms are aggravated by tactile pressure. She has tried nothing for the symptoms. The treatment provided no relief.     Problems:  Patient Active Problem List   Diagnosis Date Noted   Iron deficiency anemia 05/08/2023   Uses oral contraceptive without problem 03/07/2023   BMI (body mass index), pediatric, 5% to less than 85% for age 63/02/2021   Annual physical exam 11/18/2013    Allergies: No Known Allergies Medications:  Current Outpatient Medications:    fluconazole  (DIFLUCAN ) 150 MG tablet, Take 1 tablet (150 mg total) by mouth every 3 (three) days as needed., Disp: 2 tablet, Rfl: 0   Norethindrone-Ethinyl Estradiol -Fe Biphas (  LO LOESTRIN FE ) 1 MG-10 MCG / 10 MCG tablet, Take 1 tablet by mouth daily., Disp: 28 tablet, Rfl: 2  Observations/Objective: Patient is well-developed, well-nourished in no acute distress.  Resting comfortably at home.  Head is normocephalic, atraumatic.  No labored breathing.  Speech is clear and coherent  with logical content.  Patient is alert and oriented at baseline.    Assessment and Plan: 1. Yeast vaginitis (Primary) - fluconazole  (DIFLUCAN ) 150 MG tablet; Take 1 tablet (150 mg total) by mouth every 3 (three) days as needed.  Dispense: 2 tablet; Refill: 0  - Symptoms consistent with yeast vaginitis - Fluconazole  prescribed - Limit bubble baths, scented lotions/soaps/detergents - Limit tight fitting clothing - Seek on person evaluation if not improving or if symptoms worsen   Follow Up Instructions: I discussed the assessment and treatment plan with the patient. The patient was provided an opportunity to ask questions and all were answered. The patient agreed with the plan and demonstrated an understanding of the instructions.  A copy of instructions were sent to the patient via MyChart unless otherwise noted below.    The patient was advised to call back or seek an in-person evaluation if the symptoms worsen or if the condition fails to improve as anticipated.    Angelia Kelp, PA-C

## 2023-06-25 ENCOUNTER — Ambulatory Visit: Admitting: Pediatrics

## 2023-06-25 ENCOUNTER — Encounter: Payer: Self-pay | Admitting: Pediatrics

## 2023-06-25 ENCOUNTER — Ambulatory Visit (INDEPENDENT_AMBULATORY_CARE_PROVIDER_SITE_OTHER): Admitting: Pediatrics

## 2023-06-25 VITALS — Wt 130.0 lb

## 2023-06-25 DIAGNOSIS — D509 Iron deficiency anemia, unspecified: Secondary | ICD-10-CM

## 2023-06-25 LAB — POCT HEMOGLOBIN: Hemoglobin: 12.5 g/dL (ref 11–14.6)

## 2023-06-25 NOTE — Progress Notes (Signed)
 Presents today for iron re-check based on history of anemia. Has not been taking iron supplementation recently.   Results for orders placed or performed in visit on 06/25/23 (from the past 24 hours)  POCT hemoglobin     Status: Normal   Collection Time: 06/25/23 12:03 PM  Result Value Ref Range   Hemoglobin 12.5 11 - 14.6 g/dL   Continue iron supplementation as recommended

## 2023-06-25 NOTE — Patient Instructions (Signed)
 Iron-Rich Diet    Iron is a mineral that helps your body produce hemoglobin. Hemoglobin is a protein in red blood cells that carries oxygen to your body's tissues. Eating too little iron may cause you to feel weak and tired, and it can increase your risk of infection. Iron is naturally found in many foods, and many foods have iron added to them (are iron-fortified).  You may need to follow an iron-rich diet if you do not have enough iron in your body due to certain medical conditions. The amount of iron that you need each day depends on your age, your sex, and any medical conditions you have. Follow instructions from your health care provider or a dietitian about how much iron you should eat each day.  What are tips for following this plan?  Reading food labels  Check food labels to see how many milligrams (mg) of iron are in each serving.  Cooking  Cook foods in pots and pans that are made from iron.  Take these steps to make it easier for your body to absorb iron from certain foods:  Soak beans overnight before cooking.  Soak whole grains overnight and drain them before using.  Ferment flours before baking, such as by using yeast in bread dough.  Meal planning  When you eat foods that contain iron, you should eat them with foods that are high in vitamin C. These include oranges, peppers, tomatoes, potatoes, and mangoes. Vitamin C helps your body absorb iron.  Certain foods and drinks prevent your body from absorbing iron properly. Avoid eating these foods in the same meal as iron-rich foods or with iron supplements. These foods include:  Coffee, black tea, and red wine.  Milk, dairy products, and foods that are high in calcium.  Beans and soybeans.  Whole grains.  General information  Take iron supplements only as told by your health care provider. An overdose of iron can be life-threatening. If you were prescribed iron supplements, take them with orange juice or a vitamin C supplement.  When you eat  iron-fortified foods or take an iron supplement, you should also eat foods that naturally contain iron, such as meat, poultry, and fish. Eating naturally iron-rich foods helps your body absorb the iron that is added to other foods or contained in a supplement.  Iron from animal sources is better absorbed than iron from plant sources.  What foods should I eat?  Vegetables  Spinach (cooked). Green peas. Broccoli. Fermented vegetables.  Eat vegetables high in vitamin C, such as leafy greens, potatoes, bell peppers, and tomatoes, with iron-rich foods.  Grains  Iron-fortified breakfast cereal. Iron-fortified whole-wheat bread. Enriched rice. Sprouted grains.  Meats and other proteins  Beef liver. Beef. [place]. Chicken. Oysters. Shrimp. Tuna. Sardines. Chickpeas. Nuts. Tofu. Pumpkin seeds.  Beverages  Tomato juice. Fresh orange juice. Prune juice. Hibiscus tea. Iron-fortified instant breakfast shakes.  Sweets and desserts  Blackstrap molasses.  Seasonings and condiments  Tahini. Fermented soy sauce.  Other foods  Wheat germ.  The items listed above may not be a complete list of recommended foods and beverages. Contact a dietitian for more information.  What foods should I limit?  These are foods that should be limited while eating iron-rich foods as they can reduce the absorption of iron in your body.  Grains  Whole grains. Bran cereal. Bran flour.  Meats and other proteins  Soybeans. Products made from soy protein. Black beans. Lentils. Mung beans. Split peas.  Dairy  Milk.  Cream. Cheese. Yogurt. Cottage cheese.  Beverages  Coffee. Black tea. Red wine.  Sweets and desserts  Cocoa. Chocolate. Ice cream.  Seasonings and condiments  Basil. Oregano. Large amounts of parsley.  The items listed above may not be a complete list of foods and beverages you should limit. Contact a dietitian for more information.  Summary  Iron is a mineral that helps your body produce hemoglobin. Hemoglobin is a protein in red blood cells that  carries oxygen to your body's tissues.  Iron is naturally found in many foods, and many foods have iron added to them (are iron-fortified).  When you eat foods that contain iron, you should eat them with foods that are high in vitamin C. Vitamin C helps your body absorb iron.  Certain foods and drinks prevent your body from absorbing iron properly, such as whole grains and dairy products. You should avoid eating these foods in the same meal as iron-rich foods or with iron supplements.  This information is not intended to replace advice given to you by your health care provider. Make sure you discuss any questions you have with your health care provider.  Document Revised: 12/09/2022 Document Reviewed: 12/09/2022  Elsevier Patient Education  2025 [employer].

## 2023-06-28 ENCOUNTER — Other Ambulatory Visit: Payer: Self-pay

## 2023-06-28 DIAGNOSIS — I889 Nonspecific lymphadenitis, unspecified: Secondary | ICD-10-CM | POA: Insufficient documentation

## 2023-06-28 DIAGNOSIS — L02411 Cutaneous abscess of right axilla: Secondary | ICD-10-CM | POA: Diagnosis present

## 2023-06-28 DIAGNOSIS — L049 Acute lymphadenitis, unspecified: Secondary | ICD-10-CM | POA: Diagnosis not present

## 2023-06-28 NOTE — ED Triage Notes (Signed)
 Pt POV from home reporting cyst in R axilla, hx HS, requesting to be drained.

## 2023-06-29 ENCOUNTER — Other Ambulatory Visit (HOSPITAL_COMMUNITY): Payer: Self-pay

## 2023-06-29 ENCOUNTER — Emergency Department (HOSPITAL_BASED_OUTPATIENT_CLINIC_OR_DEPARTMENT_OTHER)
Admission: EM | Admit: 2023-06-29 | Discharge: 2023-06-29 | Disposition: A | Discharge status: Home / Self Care | Attending: Emergency Medicine | Admitting: Emergency Medicine

## 2023-06-29 ENCOUNTER — Encounter (HOSPITAL_BASED_OUTPATIENT_CLINIC_OR_DEPARTMENT_OTHER): Payer: Self-pay

## 2023-06-29 DIAGNOSIS — I889 Nonspecific lymphadenitis, unspecified: Secondary | ICD-10-CM

## 2023-06-29 MED ORDER — FLUCONAZOLE 200 MG PO TABS
200.0000 mg | ORAL_TABLET | Freq: Once | ORAL | 0 refills | Status: AC
  Filled 2023-06-29: qty 1, 1d supply, fill #0

## 2023-06-29 MED ORDER — CLINDAMYCIN HCL 300 MG PO CAPS
300.0000 mg | ORAL_CAPSULE | Freq: Three times a day (TID) | ORAL | 0 refills | Status: AC
  Filled 2023-06-29: qty 15, 5d supply, fill #0

## 2023-06-29 MED ORDER — LIDOCAINE-EPINEPHRINE-TETRACAINE (LET) TOPICAL GEL
3.0000 mL | Freq: Once | TOPICAL | Status: AC
  Administered 2023-06-29: 3 mL via TOPICAL
  Filled 2023-06-29: qty 3

## 2023-06-29 MED ORDER — CLINDAMYCIN HCL 150 MG PO CAPS
300.0000 mg | ORAL_CAPSULE | Freq: Once | ORAL | Status: AC
  Administered 2023-06-29: 300 mg via ORAL
  Filled 2023-06-29: qty 2

## 2023-06-29 NOTE — ED Provider Notes (Signed)
  EMERGENCY DEPARTMENT AT [facility] Provider Note   CSN: [id number] Arrival date & time: 06/28/23  2016     History Chief Complaint  Patient presents with   Cyst    HPI Shannon Owens is a 18 y.o. female presenting for right axilla abscess present for the past few days. Denies sys sxs   Patient's recorded medical, surgical, social, medication list and allergies were reviewed in the Snapshot window as part of the initial history.   Review of Systems   Review of Systems  Constitutional:  Negative for chills and fever.  HENT:  Negative for ear pain and sore throat.   Eyes:  Negative for pain and visual disturbance.  Respiratory:  Negative for cough and shortness of breath.   Cardiovascular:  Negative for chest pain and palpitations.  Gastrointestinal:  Negative for abdominal pain and vomiting.  Genitourinary:  Negative for dysuria and hematuria.  Musculoskeletal:  Negative for arthralgias and back pain.  Skin:  Negative for color change and rash.  Neurological:  Negative for seizures and syncope.  All other systems reviewed and are negative.   Physical Exam Updated Vital Signs BP 112/79   Pulse 79   Temp 98.6 F (37 C)   Resp 17   Ht 5' 6 (1.676 m)   Wt 59 kg   LMP 05/28/2023 (Approximate)   SpO2 99%   BMI 20.98 kg/m  Physical Exam Vitals and nursing note reviewed.  Constitutional:      General: She is not in acute distress.    Appearance: She is well-developed.  HENT:     Head: Normocephalic and atraumatic.   Eyes:     Conjunctiva/sclera: Conjunctivae normal.    Cardiovascular:     Rate and Rhythm: Normal rate and regular rhythm.     Heart sounds: No murmur heard. Pulmonary:     Effort: Pulmonary effort is normal. No respiratory distress.     Breath sounds: Normal breath sounds.  Abdominal:     General: There is no distension.     Palpations: Abdomen is soft.     Tenderness: There is no abdominal tenderness. There is no right  CVA tenderness or left CVA tenderness.   Musculoskeletal:        General: No swelling or tenderness. Normal range of motion.     Cervical back: Neck supple.   Skin:    General: Skin is warm and dry.   Neurological:     General: No focal deficit present.     Mental Status: She is alert and oriented to person, place, and time. Mental status is at baseline.     Cranial Nerves: No cranial nerve deficit.      ED Course/ Medical Decision Making/ A&P    Procedures .Ultrasound ED Soft Tissue  Date/Time: 06/29/2023 1:55 AM  Performed by: Jerral Meth, MD Authorized by: Jerral Meth, MD   Procedure details:    Indications: limb pain and localization of abscess     Transverse view:  Visualized   Longitudinal view:  Visualized   Images: archived   Location:    Location: axilla     Side:  Right Findings:     no abscess present    no cellulitis present .Incision and Drainage  Date/Time: 06/29/2023 1:56 AM  Performed by: Jerral Meth, MD Authorized by: Jerral Meth, MD   Consent:    Consent obtained:  Verbal   Consent given by:  Patient   Risks, benefits, and alternatives were discussed:  yes     Risks discussed:  Bleeding and incomplete drainage Location:    Type:  Fluid collection   Location:  Upper extremity Pre-procedure details:    Skin preparation:  Chlorhexidine with alcohol Procedure type:    Complexity:  Simple Procedure details:    Incision types:  Stab incision   Drainage:  Bloody   Packing materials:  1/2 in gauze Post-procedure details:    Procedure completion:  Tolerated    Medications Ordered in ED Medications  clindamycin  (CLEOCIN ) capsule 300 mg (has no administration in time range)  lidocaine -EPINEPHrine -tetracaine  (LET) topical gel (3 mLs Topical Given 06/29/23 0116)    Medical Decision Making:   Is a 18 year old female with a reported history of hidradenitis presenting with right axillary swelling. Has tried supportive care  at home.  It is uncomfortable to deep palpation. Denies fevers chills nausea vomiting syncope shortness of breath. History of present illness feels and findings are most consistent with either lymphadenitis or axillary abscess. The ultrasound was not diagnostic.  There is some trace fluid motion, however the lesion also appears to have blood flow on color flow. Shared medical decision making with the patient, could trial just antibiotic therapy or stab incision for further diagnostics.  Patient would like to trial drainage.  Stab incision retrieved only bloody discharge without any purulence.  This is more diagnostic of a lymphadenitis.  Will treat with clindamycin  recommend follow-up with PCP in 72 hours for reassessment.   Clinical Impression:  1. Lymphadenitis      Discharge   Final Clinical Impression(s) / ED Diagnoses Final diagnoses:  Lymphadenitis    Rx / DC Orders ED Discharge Orders          Ordered    clindamycin  (CLEOCIN ) 300 MG capsule  3 times daily        06/29/23 0115              Jerral Meth, MD 06/29/23 9841

## 2023-09-11 ENCOUNTER — Other Ambulatory Visit (HOSPITAL_COMMUNITY): Payer: Self-pay

## 2023-09-30 ENCOUNTER — Ambulatory Visit (INDEPENDENT_AMBULATORY_CARE_PROVIDER_SITE_OTHER): Payer: Self-pay | Admitting: Pediatrics

## 2023-09-30 ENCOUNTER — Encounter: Payer: Self-pay | Admitting: Pediatrics

## 2023-09-30 VITALS — Wt 131.0 lb

## 2023-09-30 DIAGNOSIS — Z23 Encounter for immunization: Secondary | ICD-10-CM | POA: Insufficient documentation

## 2023-09-30 DIAGNOSIS — D509 Iron deficiency anemia, unspecified: Secondary | ICD-10-CM

## 2023-09-30 DIAGNOSIS — Z889 Allergy status to unspecified drugs, medicaments and biological substances status: Secondary | ICD-10-CM | POA: Diagnosis not present

## 2023-09-30 LAB — POCT HEMOGLOBIN: Hemoglobin: 12.9 g/dL (ref 11–14.6)

## 2023-09-30 NOTE — Progress Notes (Signed)
 Presents today for iron re-check based on history of anemia. Has been taking iron supplementation daily.   Additionally, Social research officer, government would like repeat allergy  lab work as patient has had positive skin and food allergy  tests positive in the past. Labs drawn today.   Flu vaccine per orders. Indications, contraindications and side effects of vaccine/vaccines discussed with parent and parent verbally expressed understanding and also agreed with the administration of vaccine/vaccines as ordered above today.Handout (VIS) given for each vaccine at this visit.  Orders Placed This Encounter  Procedures   Flu vaccine trivalent PF, 6mos and older(Flulaval,Afluria,Fluarix,Fluzone)   CBC with Differential/Platelet   Comprehensive Metabolic Panel (CMET)   Folate   Ferritin   RESPIRATORY ALLERGY  PANEL REGION II W/ RFLX: [place]   Food Allergy  Profile   POCT hemoglobin   Results for orders placed or performed in visit on 09/30/23 (from the past 24 hours)  POCT hemoglobin     Status: Normal   Collection Time: 09/30/23  4:56 PM  Result Value Ref Range   Hemoglobin 12.9 11 - 14.6 g/dL  POCT test results given to patient to give to recruiter.

## 2023-09-30 NOTE — Patient Instructions (Signed)
 Iron-Rich Diet    Iron is a mineral that helps your body produce hemoglobin. Hemoglobin is a protein in red blood cells that carries oxygen to your body's tissues. Eating too little iron may cause you to feel weak and tired, and it can increase your risk of infection. Iron is naturally found in many foods, and many foods have iron added to them (are iron-fortified).  You may need to follow an iron-rich diet if you do not have enough iron in your body due to certain medical conditions. The amount of iron that you need each day depends on your age, your sex, and any medical conditions you have. Follow instructions from your health care provider or a dietitian about how much iron you should eat each day.  What are tips for following this plan?  Reading food labels  Check food labels to see how many milligrams (mg) of iron are in each serving.  Cooking  Cook foods in pots and pans that are made from iron.  Take these steps to make it easier for your body to absorb iron from certain foods:  Soak beans overnight before cooking.  Soak whole grains overnight and drain them before using.  Ferment flours before baking, such as by using yeast in bread dough.  Meal planning  When you eat foods that contain iron, you should eat them with foods that are high in vitamin C. These include oranges, peppers, tomatoes, potatoes, and mangoes. Vitamin C helps your body absorb iron.  Certain foods and drinks prevent your body from absorbing iron properly. Avoid eating these foods in the same meal as iron-rich foods or with iron supplements. These foods include:  Coffee, black tea, and red wine.  Milk, dairy products, and foods that are high in calcium.  Beans and soybeans.  Whole grains.  General information  Take iron supplements only as told by your health care provider. An overdose of iron can be life-threatening. If you were prescribed iron supplements, take them with orange juice or a vitamin C supplement.  When you eat  iron-fortified foods or take an iron supplement, you should also eat foods that naturally contain iron, such as meat, poultry, and fish. Eating naturally iron-rich foods helps your body absorb the iron that is added to other foods or contained in a supplement.  Iron from animal sources is better absorbed than iron from plant sources.  What foods should I eat?  Vegetables  Spinach (cooked). Green peas. Broccoli. Fermented vegetables.  Eat vegetables high in vitamin C, such as leafy greens, potatoes, bell peppers, and tomatoes, with iron-rich foods.  Grains  Iron-fortified breakfast cereal. Iron-fortified whole-wheat bread. Enriched rice. Sprouted grains.  Meats and other proteins  Beef liver. Beef. [place]. Chicken. Oysters. Shrimp. Tuna. Sardines. Chickpeas. Nuts. Tofu. Pumpkin seeds.  Beverages  Tomato juice. Fresh orange juice. Prune juice. Hibiscus tea. Iron-fortified instant breakfast shakes.  Sweets and desserts  Blackstrap molasses.  Seasonings and condiments  Tahini. Fermented soy sauce.  Other foods  Wheat germ.  The items listed above may not be a complete list of recommended foods and beverages. Contact a dietitian for more information.  What foods should I limit?  These are foods that should be limited while eating iron-rich foods as they can reduce the absorption of iron in your body.  Grains  Whole grains. Bran cereal. Bran flour.  Meats and other proteins  Soybeans. Products made from soy protein. Black beans. Lentils. Mung beans. Split peas.  Dairy  Milk.  Cream. Cheese. Yogurt. Cottage cheese.  Beverages  Coffee. Black tea. Red wine.  Sweets and desserts  Cocoa. Chocolate. Ice cream.  Seasonings and condiments  Basil. Oregano. Large amounts of parsley.  The items listed above may not be a complete list of foods and beverages you should limit. Contact a dietitian for more information.  Summary  Iron is a mineral that helps your body produce hemoglobin. Hemoglobin is a protein in red blood cells that  carries oxygen to your body's tissues.  Iron is naturally found in many foods, and many foods have iron added to them (are iron-fortified).  When you eat foods that contain iron, you should eat them with foods that are high in vitamin C. Vitamin C helps your body absorb iron.  Certain foods and drinks prevent your body from absorbing iron properly, such as whole grains and dairy products. You should avoid eating these foods in the same meal as iron-rich foods or with iron supplements.  This information is not intended to replace advice given to you by your health care provider. Make sure you discuss any questions you have with your health care provider.  Document Revised: 12/09/2022 Document Reviewed: 12/09/2022  Elsevier Patient Education  2025 [employer].

## 2023-10-03 ENCOUNTER — Encounter: Payer: Self-pay | Admitting: Obstetrics and Gynecology

## 2023-10-03 ENCOUNTER — Ambulatory Visit: Admitting: Obstetrics and Gynecology

## 2023-10-03 ENCOUNTER — Other Ambulatory Visit (HOSPITAL_COMMUNITY)
Admission: RE | Admit: 2023-10-03 | Discharge: 2023-10-03 | Disposition: A | Discharge status: Home / Self Care | Source: Ambulatory Visit | Attending: Obstetrics and Gynecology | Admitting: Obstetrics and Gynecology

## 2023-10-03 VITALS — BP 112/71 | HR 86 | Ht 66.5 in | Wt 131.0 lb

## 2023-10-03 DIAGNOSIS — Z3202 Encounter for pregnancy test, result negative: Secondary | ICD-10-CM | POA: Diagnosis not present

## 2023-10-03 DIAGNOSIS — Z975 Presence of (intrauterine) contraceptive device: Secondary | ICD-10-CM | POA: Insufficient documentation

## 2023-10-03 DIAGNOSIS — Z113 Encounter for screening for infections with a predominantly sexual mode of transmission: Secondary | ICD-10-CM | POA: Diagnosis not present

## 2023-10-03 DIAGNOSIS — Z30017 Encounter for initial prescription of implantable subdermal contraceptive: Secondary | ICD-10-CM

## 2023-10-03 LAB — POCT URINE PREGNANCY: Preg Test, Ur: NEGATIVE

## 2023-10-03 MED ORDER — ETONOGESTREL 68 MG ~~LOC~~ IMPL
68.0000 mg | DRUG_IMPLANT | Freq: Once | SUBCUTANEOUS | Status: AC
  Administered 2023-10-03: 68 mg via SUBCUTANEOUS

## 2023-10-03 NOTE — Addendum Note (Signed)
 Addended by: JERRYE AREA D on: 10/03/2023 09:10 AM   Modules accepted: Orders

## 2023-10-03 NOTE — Progress Notes (Signed)
 Pt is in office for Nexplanon .  Pt was on OCP - stopped taking 3 weeks ago. LMP 09/06/23 Last intercourse 09/27/23 with condom.

## 2023-10-03 NOTE — Progress Notes (Signed)
 NEW GYNECOLOGY VISIT Chief Complaint  Patient presents with   Contraception     Subjective:  Shannon Owens is a 18 y.o. G0P0000 who presents for nexplanon  insertion  She is doing well and has no concerns. Previously using OCPs but stopped few weeks ago. Using condoms in the meantime.  Gyn History: Patient's last menstrual period was 09/06/2023. 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   Family history of diabetes mellitus (DM) 03/31/2022   Gonorrhea 05/10/2022   Vitamin D  deficiency 03/31/2022    Past Surgical History:  Procedure Laterality Date   EYE SURGERY     TONSILLECTOMY      Social History   Socioeconomic History   Marital status: Single    Spouse name: Not on file   Number of children: Not on file   Years of education: Not on file   Highest education level: Not on file  Occupational History   Not on file  Tobacco Use   Smoking status: Never   Smokeless tobacco: Never  Vaping Use   Vaping status: Never Used  Substance and Sexual Activity   Alcohol use: Never   Drug use: Never   Sexual activity: Not Currently  Other Topics Concern   Not on file  Social History Narrative   ** Merged History Encounter **       5th grade at [employer] Lives with mom and mom's boyfriend and 3 siblings    Social Drivers of Corporate investment banker Strain: Not on file  Food  Insecurity: Not on file  Transportation Needs: Not on file  Physical Activity: Not on file  Stress: Not on file  Social Connections: Not on file    Family History  Problem Relation Age of Onset   Healthy Mother    Healthy Father    Hypertension Paternal Grandmother    Hypertension Paternal Grandfather    Diabetes Paternal Grandfather    Alcohol abuse Neg Hx    Arthritis Neg Hx    Asthma Neg Hx    Birth defects Neg Hx    Cancer Neg Hx    COPD Neg Hx    Depression Neg Hx    Drug abuse Neg Hx    Early death Neg Hx    Hearing loss Neg Hx    Heart disease Neg Hx    Hyperlipidemia Neg Hx    Learning disabilities Neg Hx    Kidney disease Neg Hx    Mental illness Neg Hx    Mental retardation Neg Hx    Miscarriages / Stillbirths Neg Hx    Stroke Neg Hx    Vision loss Neg Hx    Varicose Veins Neg Hx  Current Outpatient Medications on File Prior to Visit  Medication Sig Dispense Refill   Norethindrone-Ethinyl Estradiol -Fe Biphas (LO LOESTRIN FE ) 1 MG-10 MCG / 10 MCG tablet Take 1 tablet by mouth daily. (Patient not taking: Reported on 10/03/2023) 28 tablet 2   No current facility-administered medications on file prior to visit.    No Known Allergies   Objective:   Vitals:   10/03/23 0836  BP: 112/71  Pulse: 86  Weight: 131 lb (59.4 kg)  Height: 5' 6.5 (1.689 m)    Nexplanon  insertion Procedure Patient identified, informed consent performed, consent signed.   We reviewed the risks of pain, bleeding, bruising, infection, damage to surrounding structures. Patient does understand that irregular bleeding is a very common side effect of this medication. She was advised to have backup contraception for at least one week after placement. Pregnancy test in clinic today was negative.  Appropriate time out taken.  Patient's left arm was marked, prepped and draped in the usual sterile fashion. Patient was prepped with betadine and then injected with 2 ml of 1% lidocaine .   Nexplanon  removed from packaging,  Device confirmed in needle, then inserted full length of needle and withdrawn per handbook instructions. Nexplanon  was able to palpated in the patient's arm; patient palpated the insert herself. There was minimal blood loss.  Patient insertion site covered with steri strips, gauze and a pressure bandage to reduce any bruising.  The patient tolerated the procedure well and was given post procedure instructions.   Assessment and Plan:  1. Nexplanon  insertion (Primary) Uncomplicated insertion Postprocedure instructions reviewed  2. Routine screening for STI (sexually transmitted infection) Self swab, declines blood work - Cervicovaginal ancillary only   Future Appointments  Date Time Provider Department Center  10/08/2023 10:35 AM Sherah Lund, Rollo DASEN, MD [facility] None    Rollo DASEN Bring, MD, FACOG Obstetrician & Gynecologist, [facility] for [facility], [facility]

## 2023-10-04 LAB — CERVICOVAGINAL ANCILLARY ONLY
Chlamydia: NEGATIVE
Comment: NEGATIVE
Comment: NEGATIVE
Comment: NORMAL
Neisseria Gonorrhea: NEGATIVE
Trichomonas: NEGATIVE

## 2023-10-05 ENCOUNTER — Ambulatory Visit: Payer: Self-pay | Admitting: Obstetrics and Gynecology

## 2023-10-08 ENCOUNTER — Ambulatory Visit: Admitting: Obstetrics and Gynecology

## 2023-10-09 LAB — FOOD ALLERGY PROFILE
Allergen, Salmon, f41: <0.1 kU/L
Almonds: 0.13 kU/L — ABNORMAL HIGH
Brazil Nut: <0.1 kU/L
CLASS: 0
CLASS: 0
CLASS: 0
CLASS: 0
CLASS: 0
CLASS: 0
CLASS: 0
CLASS: 1
CLASS: 2
Cashew IgE: <0.1 kU/L
Class: 0
Class: 0
Class: 0
Class: 0
Egg White IgE: <0.1 kU/L
Fish Cod: <0.1 kU/L
Hazelnut: 0.86 kU/L — ABNORMAL HIGH
Macadamia Nut: <0.1 kU/L
Milk IgE: <0.1 kU/L
Peanut IgE: 0.3 kU/L — ABNORMAL HIGH
Scallop IgE: <0.1 kU/L
Sesame Seed f10: 0.62 kU/L — ABNORMAL HIGH
Shrimp IgE: <0.1 kU/L
Soybean IgE: 0.15 kU/L — ABNORMAL HIGH
Tuna IgE: <0.1 kU/L
Walnut: <0.1 kU/L
Wheat IgE: 0.18 kU/L — ABNORMAL HIGH

## 2023-10-09 LAB — COMPREHENSIVE METABOLIC PANEL WITH GFR
AG Ratio: 2 (calc) (ref 1.0–2.5)
ALT: 8 U/L (ref 5–32)
AST: 14 U/L (ref 12–32)
Albumin: 4.8 g/dL (ref 3.6–5.1)
Alkaline phosphatase (APISO): 74 U/L (ref 36–128)
BUN: 11 mg/dL (ref 7–20)
CO2: 25 mmol/L (ref 20–32)
Calcium: 9.7 mg/dL (ref 8.9–10.4)
Chloride: 105 mmol/L (ref 98–110)
Creat: 0.85 mg/dL (ref 0.50–0.96)
Globulin: 2.4 g/dL (ref 2.0–3.8)
Glucose, Bld: 76 mg/dL (ref 65–99)
Potassium: 4.2 mmol/L (ref 3.8–5.1)
Sodium: 139 mmol/L (ref 135–146)
Total Bilirubin: 0.7 mg/dL (ref 0.2–1.1)
Total Protein: 7.2 g/dL (ref 6.3–8.2)
eGFR: 101 mL/min/1.73m2 (ref >=60)

## 2023-10-09 LAB — RESPIRATORY ALLERGY PANEL REGION II W/ RFLX: ~~LOC~~
Allergen, A. alternata, m6: <0.1 kU/L
Allergen, Cedar tree, t12: <0.1 kU/L
Allergen, Comm Silver Birch, t9: 1.91 kU/L — ABNORMAL HIGH
Allergen, Cottonwood, t14: 0.33 kU/L — ABNORMAL HIGH
Allergen, D pternoyssinus,d7: 5 kU/L — ABNORMAL HIGH
Allergen, Mouse Urine Protein, e78: <0.1 kU/L
Allergen, Mulberry, t76: <0.1 kU/L
Allergen, Oak,t7: 2.44 kU/L — ABNORMAL HIGH
Allergen, P. notatum, m1: <0.1 kU/L
Aspergillus fumigatus, m3: <0.1 kU/L
Bermuda Grass: 0.17 kU/L — ABNORMAL HIGH
Box Elder IgE: 0.26 kU/L — ABNORMAL HIGH
CLADOSPORIUM HERBARUM (M2) IGE: <0.1 kU/L
COMMON RAGWEED (SHORT) (W1) IGE: 0.22 kU/L — ABNORMAL HIGH
Cat Dander: 1.61 kU/L — ABNORMAL HIGH
Class: 0
Class: 0
Class: 0
Class: 0
Class: 0
Class: 0
Class: 0
Class: 0
Class: 0
Class: 0
Class: 1
Class: 2
Class: 2
Class: 2
Class: 2
Class: 2
Class: 3
Class: 3
Class: 3
Cockroach: <0.1 kU/L
D. farinae: 6.82 kU/L — ABNORMAL HIGH
Dog Dander: 3.84 kU/L — ABNORMAL HIGH
Elm IgE: 0.41 kU/L — ABNORMAL HIGH
IgE (Immunoglobulin E), Serum: 36 kU/L (ref <=114)
Johnson Grass: <0.1 kU/L
Pecan/Hickory Tree IgE: 0.8 kU/L — ABNORMAL HIGH
Rough Pigweed  IgE: <0.1 kU/L
Sheep Sorrel IgE: 0.31 kU/L — ABNORMAL HIGH
Timothy Grass: 1.21 kU/L — ABNORMAL HIGH

## 2023-10-09 LAB — CBC WITH DIFFERENTIAL/PLATELET
Absolute Lymphocytes: 3199 {cells}/uL (ref 850–3900)
Absolute Monocytes: 595 {cells}/uL (ref 200–950)
Basophils Absolute: 31 {cells}/uL (ref 0–200)
Basophils Relative: 0.5 %
Eosinophils Absolute: 192 {cells}/uL (ref 15–500)
Eosinophils Relative: 3.1 %
HCT: 42.4 % (ref 35.0–45.0)
Hemoglobin: 13.4 g/dL (ref 11.7–15.5)
MCH: 28.3 pg (ref 27.0–33.0)
MCHC: 31.6 g/dL — ABNORMAL LOW (ref 32.0–36.0)
MCV: 89.5 fL (ref 80.0–100.0)
MPV: 10.5 fL (ref 7.5–12.5)
Monocytes Relative: 9.6 %
Neutro Abs: 2182 {cells}/uL (ref 1500–7800)
Neutrophils Relative %: 35.2 %
Platelets: 299 Thousand/uL (ref 140–400)
RBC: 4.74 Million/uL (ref 3.80–5.10)
RDW: 11.7 % (ref 11.0–15.0)
Total Lymphocyte: 51.6 %
WBC: 6.2 Thousand/uL (ref 3.8–10.8)

## 2023-10-09 LAB — MISC HAZELNUT COMP PNL
Cor a1(f428): 1.43 kU/L — ABNORMAL HIGH (ref <0.10)
Cor a14(f439): <0.1 kU/L (ref <0.10)
Cor a8(f425): <0.1 kU/L (ref <0.10)
Cor a9(f440): <0.1 kU/L (ref <0.10)

## 2023-10-09 LAB — FERRITIN: Ferritin: 9 ng/mL — ABNORMAL LOW (ref 16–154)

## 2023-10-09 LAB — FOLATE: Folate: 8.6 ng/mL

## 2023-10-09 LAB — CAT DANDER COMPONENT
E220-IgE Fel d 2: <0.1 kU/L (ref <0.10)
E228-IgE Fel d 4: 0.15 kU/L — ABNORMAL HIGH (ref <0.10)
Fel d 1 (e94) IgE: 2.01 kU/L — ABNORMAL HIGH (ref <0.10)
Fel d 7 (e231) IgE: 0.33 kU/L — ABNORMAL HIGH (ref <0.10)

## 2023-10-09 LAB — PEANUT COMPONENT PANEL REFLEX
Ara h 1 (f422): <0.1 kU/L (ref <0.10)
Ara h 2 (f423): <0.1 kU/L (ref <0.10)
Ara h 3 (f424): <0.1 kU/L (ref <0.10)
Ara h 8 (f352): 0.94 kU/L — ABNORMAL HIGH (ref <0.10)
Ara h 9 (f427: <0.1 kU/L (ref <0.10)
F447-IgE Ara h 6: <0.1 kU/L (ref <0.10)

## 2023-10-09 LAB — INTERPRETATION:

## 2023-10-09 LAB — DOG DANDER COMPONENT
Can f 4(e229) IgE: 1.03 kU/L — ABNORMAL HIGH (ref <0.10)
Can f 6(e230) IgE: 1.07 kU/L — ABNORMAL HIGH (ref <0.10)
E101-IgE Can f 1: 0.92 kU/L — ABNORMAL HIGH (ref <0.10)
E102-IgE Can f 2: 1.08 kU/L — ABNORMAL HIGH (ref <0.10)
E221-IgE Can f 3: <0.1 kU/L (ref <0.10)
E226-IgE Can f 5: 1.97 kU/L — ABNORMAL HIGH (ref <0.10)

## 2023-10-14 ENCOUNTER — Telehealth: Admitting: Physician Assistant

## 2023-10-14 ENCOUNTER — Other Ambulatory Visit (HOSPITAL_COMMUNITY): Payer: Self-pay

## 2023-10-14 DIAGNOSIS — J069 Acute upper respiratory infection, unspecified: Secondary | ICD-10-CM | POA: Diagnosis not present

## 2023-10-14 MED ORDER — ONDANSETRON 4 MG PO TBDP
4.0000 mg | ORAL_TABLET | Freq: Three times a day (TID) | ORAL | 0 refills | Status: AC | PRN
  Filled 2023-10-14: qty 20, 7d supply, fill #0

## 2023-10-14 MED ORDER — NAPROXEN 500 MG PO TABS
500.0000 mg | ORAL_TABLET | Freq: Two times a day (BID) | ORAL | 0 refills | Status: AC
  Filled 2023-10-14: qty 30, 15d supply, fill #0

## 2023-10-14 NOTE — Progress Notes (Signed)
 E-Visit for [employer] Virus / COVID Screening  Your current symptoms could be consistent with COVID.  Please complete a Covid test either at home or check with your local pharmacy to see if they provide testing.    You have tested positive for COVID-19, meaning that you were infected with the novel coronavirus and could give the virus to others.  Most people with COVID-19 have mild illness and can recover at home without medical care. Do not leave your home, except to get medical care. Do not visit public areas and do not go to places where you are unable to wear a mask. It is important that you stay home  to take care for yourself and to help protect other people in your home and community.      Isolation Instructions:   You are to isolate at home until you have been fever free for at least 24 hours without a fever-reducing medication, and symptoms have been steadily improving for 24 hours. At that time,  you can end isolation but need to mask for an additional 5 days.  If you must be around other household members who do not have symptoms, you need to make sure that both you and the family members are masking consistently with a high-quality mask.  If you note any worsening of symptoms despite treatment, please seek an in-person evaluation ASAP. If you note any significant shortness of breath or any chest pain, please seek ER evaluation. Please do not delay care!  Go to the nearest hospital ED for assessment if fever/cough/breathlessness are severe or illness seems like a threat to life.    The following symptoms may appear 2-14 days after exposure: Fever Cough Shortness of breath or difficulty breathing Chills Repeated shaking with chills Muscle pain Headache Sore throat New loss of taste or smell Fatigue Congestion or runny nose Nausea or vomiting Diarrhea  You can use medication such as I have prescribed an anti-inflammatory - Naprosyn  500 mg. Take twice daily as needed for fever or  body aches for 2 weeks and I have prescribed Zofran  4 mg tablets 1 every 6 hours if needed for nausea  You may also take acetaminophen  (Tylenol ) as needed for fever.  HOME CARE Only take medications as instructed by your medical team. Drink plenty of fluids and get plenty of rest. A steam or ultrasonic humidifier can help if you have congestion.  GET HELP RIGHT AWAY IF YOU HAVE EMERGENCY WARNING SIGNS.  Call 911 or proceed to your closest emergency facility if: You develop worsening high fever. Trouble breathing Bluish lips or face Persistent pain or pressure in the chest New confusion Inability to wake or stay awake You cough up blood. Your symptoms become more severe Inability to hold down food or fluids  This list is not all possible symptoms. Contact your medical provider for any symptoms that are severe or concerning to you.   Your e-visit answers were reviewed by a board certified advanced clinical practitioner to complete your personal care plan.  Depending on the condition, your plan could have included both over the counter or prescription medications.  If there is a problem, please reply once you have received a response from your provider.  Your safety is important to us .  If you have drug allergies check your prescription carefully.    You can use MyChart to ask questions about today's visit, request a non-urgent call back, or ask for a work or school excuse for 24 hours related to  this e-Visit. If it has been greater than 24 hours you will need to follow up with your provider or enter a new e-Visit to address those concerns. You will get an e-mail in the next two days asking about your experience.  I hope that your e-visit has been valuable and will speed your recovery. Thank you for using e-visits.    I have spent 5 minutes in review of e-visit questionnaire, review and updating patient chart, medical decision making and response to patient.   Delon CHRISTELLA Dickinson,  PA-C

## 2023-11-25 ENCOUNTER — Encounter: Payer: Self-pay | Admitting: Obstetrics and Gynecology

## 2023-11-25 ENCOUNTER — Ambulatory Visit: Admitting: Obstetrics and Gynecology

## 2023-11-25 VITALS — BP 116/76 | HR 90 | Ht 66.0 in | Wt 132.8 lb

## 2023-11-25 DIAGNOSIS — N6311 Unspecified lump in the right breast, upper outer quadrant: Secondary | ICD-10-CM | POA: Diagnosis not present

## 2023-11-25 NOTE — Progress Notes (Signed)
 Pt c/o a hard spot in the right breast that she noticed 2 days ago. Denys pain. No known family Hx of breast cancer   Currently has Nexplanon  for [facility]

## 2023-11-25 NOTE — Progress Notes (Signed)
   GYNECOLOGY PROGRESS NOTE  History:  18 y.o. G0P0000 presents to [facility] for breast lump. Reports hard spot on right breast that she noticed two days ago. Denies pain, reports some pulling discomfort in her armpit when she lifts her arm   The following portions of the patient's history were reviewed and updated as appropriate: allergies, current medications, past family history, past medical history, past social history, past surgical history and problem list. Last pap smear on n/a < 19 y.o.   Health Maintenance Due  Topic Date Due   Meningococcal B Vaccine (2 of 2 - Bexsero SCDM 2-dose series) 09/04/2023   COVID-19 Vaccine (1 - 2025-26 season) Never done     Review of Systems:  Pertinent items are noted in HPI.   Objective:  Physical Exam Blood pressure 116/76, pulse 90, height 5' 6 (1.676 m), weight 132 lb 12.8 oz (60.2 kg). VS reviewed, nursing note reviewed,  Constitutional: well developed, well nourished, no distress HEENT: normocephalic Pulm/chest wall: normal effort Breast Exam: left breasts appear normal, no suspicious masses, no skin or nipple changes or  axillary nodes. Right mobile, non tender  breast lump upper outer quadrant 2x2cm, no rash or erythema present  Neuro: alert and oriented  Skin: warm, dry Psych: affect normal Pelvic exam: deferred  Assessment & Plan:  1. Mass of upper outer quadrant of right breast (Primary) Plan for ultrasound, discussed common differentials  Follow up pending results Precautions provided if symptoms change prior to u/s  - US  LIMITED ULTRASOUND INCLUDING AXILLA RIGHT BREAST; Future   Nidia Daring, FNP 3:48 PM

## 2023-12-04 ENCOUNTER — Other Ambulatory Visit

## 2023-12-11 ENCOUNTER — Ambulatory Visit
Admission: RE | Admit: 2023-12-11 | Discharge: 2023-12-11 | Disposition: A | Source: Ambulatory Visit | Attending: Obstetrics and Gynecology

## 2023-12-11 DIAGNOSIS — N6311 Unspecified lump in the right breast, upper outer quadrant: Secondary | ICD-10-CM

## 2023-12-11 DIAGNOSIS — N6489 Other specified disorders of breast: Secondary | ICD-10-CM | POA: Diagnosis not present

## 2023-12-12 ENCOUNTER — Ambulatory Visit: Payer: Self-pay | Admitting: Obstetrics and Gynecology
